# Patient Record
Sex: Female | Born: 1995 | Race: Black or African American | Hispanic: No | Marital: Single | Smoking: Never smoker
Health system: Southern US, Community
[De-identification: ages and names within clinical notes are randomized; demographics above are authoritative.]

## PROBLEM LIST (undated history)

## (undated) DIAGNOSIS — Z9109 Other allergy status, other than to drugs and biological substances: Secondary | ICD-10-CM

## (undated) HISTORY — PX: KNEE SURGERY: SHX244

---
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
## 2017-09-09 ENCOUNTER — Encounter (HOSPITAL_BASED_OUTPATIENT_CLINIC_OR_DEPARTMENT_OTHER): Payer: Self-pay | Admitting: *Deleted

## 2017-09-09 ENCOUNTER — Emergency Department (HOSPITAL_BASED_OUTPATIENT_CLINIC_OR_DEPARTMENT_OTHER)
Admission: EM | Admit: 2017-09-09 | Discharge: 2017-09-09 | Disposition: A | Discharge status: Home / Self Care | Payer: Medicaid - Out of State | Attending: Emergency Medicine | Admitting: Emergency Medicine

## 2017-09-09 ENCOUNTER — Other Ambulatory Visit: Payer: Self-pay

## 2017-09-09 DIAGNOSIS — S01111A Laceration without foreign body of right eyelid and periocular area, initial encounter: Secondary | ICD-10-CM | POA: Insufficient documentation

## 2017-09-09 DIAGNOSIS — Y9389 Activity, other specified: Secondary | ICD-10-CM | POA: Insufficient documentation

## 2017-09-09 DIAGNOSIS — Z79899 Other long term (current) drug therapy: Secondary | ICD-10-CM | POA: Insufficient documentation

## 2017-09-09 DIAGNOSIS — W228XXA Striking against or struck by other objects, initial encounter: Secondary | ICD-10-CM | POA: Insufficient documentation

## 2017-09-09 DIAGNOSIS — Y929 Unspecified place or not applicable: Secondary | ICD-10-CM | POA: Insufficient documentation

## 2017-09-09 DIAGNOSIS — Y998 Other external cause status: Secondary | ICD-10-CM | POA: Insufficient documentation

## 2017-09-09 HISTORY — DX: Other allergy status, other than to drugs and biological substances: Z91.09

## 2017-09-09 MED ORDER — LIDOCAINE HCL (PF) 1 % IJ SOLN
5.0000 mL | Freq: Once | INTRAMUSCULAR | Status: AC
  Administered 2017-09-09: 5 mL via INTRADERMAL

## 2017-09-09 MED ORDER — LIDOCAINE HCL (PF) 1 % IJ SOLN
INTRAMUSCULAR | Status: AC
  Administered 2017-09-09: 5 mL via INTRADERMAL
  Filled 2017-09-09: qty 5

## 2017-09-09 NOTE — ED Notes (Addendum)
Wound approximated well. No oozing or bleeding noted. "feel better". [facility].
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
## 2018-12-02 ENCOUNTER — Other Ambulatory Visit: Payer: Self-pay

## 2018-12-02 ENCOUNTER — Emergency Department (HOSPITAL_BASED_OUTPATIENT_CLINIC_OR_DEPARTMENT_OTHER)
Admission: EM | Admit: 2018-12-02 | Discharge: 2018-12-03 | Disposition: A | Discharge status: Home / Self Care | Payer: Self-pay | Attending: Emergency Medicine | Admitting: Emergency Medicine

## 2018-12-02 ENCOUNTER — Encounter (HOSPITAL_BASED_OUTPATIENT_CLINIC_OR_DEPARTMENT_OTHER): Payer: Self-pay | Admitting: *Deleted

## 2018-12-02 DIAGNOSIS — Y93I9 Activity, other involving external motion: Secondary | ICD-10-CM | POA: Diagnosis not present

## 2018-12-02 DIAGNOSIS — S79919A Unspecified injury of unspecified hip, initial encounter: Secondary | ICD-10-CM | POA: Insufficient documentation

## 2018-12-02 DIAGNOSIS — S199XXA Unspecified injury of neck, initial encounter: Secondary | ICD-10-CM | POA: Insufficient documentation

## 2018-12-02 DIAGNOSIS — Y92410 Unspecified street and highway as the place of occurrence of the external cause: Secondary | ICD-10-CM | POA: Insufficient documentation

## 2018-12-02 DIAGNOSIS — S299XXA Unspecified injury of thorax, initial encounter: Secondary | ICD-10-CM | POA: Insufficient documentation

## 2018-12-02 DIAGNOSIS — Y998 Other external cause status: Secondary | ICD-10-CM | POA: Diagnosis not present
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
## 2018-12-02 NOTE — ED Notes (Signed)
ED Provider at bedside. 
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
## 2018-12-03 ENCOUNTER — Emergency Department (HOSPITAL_BASED_OUTPATIENT_CLINIC_OR_DEPARTMENT_OTHER): Payer: Self-pay

## 2018-12-03 NOTE — ED Notes (Signed)
Patient transported to CT 

## 2018-12-03 NOTE — ED Notes (Signed)
Attempting to d/c patient- pt in room cursing and looking through cabinets. Pt snatched paperwork from this RN- attempting to explain discharge paperwork and pt continued to walk away saying I can read a piece of paper. Pt refused to stop at checkout desk and speak with registration.

## 2018-12-03 NOTE — ED Notes (Signed)
Pt using profanity in hallways wanting an update. RN informed pt EDP would be in shortly and scans had just resulted.

## 2018-12-03 NOTE — ED Notes (Signed)
This EMT went in to ask for a urine sample (U preg) to get x rays and patients stated " Im not peeing, that girl in the other room my girlfriend, I aint pregnant" EMT said I will let the doctor know.,

## 2018-12-03 NOTE — Discharge Instructions (Addendum)
Take ibuprofen 600 mg every 6 hours as needed for pain.  Apply ice to affected areas for 20 minutes every 2 hours while awake for the next 2 days.  Follow-up with primary doctor if symptoms not improving in the next week.

## 2018-12-03 NOTE — ED Notes (Signed)
Pt's family updated on plan of care  

## 2018-12-04 ENCOUNTER — Encounter (HOSPITAL_BASED_OUTPATIENT_CLINIC_OR_DEPARTMENT_OTHER): Payer: Self-pay | Admitting: Emergency Medicine

## 2018-12-04 ENCOUNTER — Emergency Department (HOSPITAL_BASED_OUTPATIENT_CLINIC_OR_DEPARTMENT_OTHER)
Admission: EM | Admit: 2018-12-04 | Discharge: 2018-12-04 | Discharge status: Against Medical Advice | Payer: No Typology Code available for payment source | Attending: Emergency Medicine | Admitting: Emergency Medicine

## 2018-12-04 ENCOUNTER — Other Ambulatory Visit: Payer: Self-pay

## 2018-12-04 DIAGNOSIS — Z5321 Procedure and treatment not carried out due to patient leaving prior to being seen by health care provider: Secondary | ICD-10-CM | POA: Insufficient documentation

## 2018-12-04 DIAGNOSIS — M542 Cervicalgia: Secondary | ICD-10-CM | POA: Insufficient documentation

## 2018-12-04 DIAGNOSIS — M549 Dorsalgia, unspecified: Secondary | ICD-10-CM | POA: Diagnosis present
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
## 2018-12-04 NOTE — ED Triage Notes (Signed)
Pt's chart at [facility] and previous ED visit in our system reviewed. No muscle relaxer prescribed during either visit. Unsure of what patient is taking.

## 2018-12-05 ENCOUNTER — Encounter (HOSPITAL_BASED_OUTPATIENT_CLINIC_OR_DEPARTMENT_OTHER): Payer: Self-pay

## 2018-12-05 ENCOUNTER — Emergency Department (HOSPITAL_BASED_OUTPATIENT_CLINIC_OR_DEPARTMENT_OTHER): Payer: No Typology Code available for payment source

## 2018-12-05 ENCOUNTER — Emergency Department (HOSPITAL_BASED_OUTPATIENT_CLINIC_OR_DEPARTMENT_OTHER)
Admission: EM | Admit: 2018-12-05 | Discharge: 2018-12-05 | Disposition: A | Discharge status: Home / Self Care | Payer: No Typology Code available for payment source | Attending: Emergency Medicine | Admitting: Emergency Medicine

## 2018-12-05 ENCOUNTER — Other Ambulatory Visit: Payer: Self-pay

## 2018-12-05 DIAGNOSIS — M542 Cervicalgia: Secondary | ICD-10-CM | POA: Insufficient documentation

## 2018-12-05 DIAGNOSIS — R51 Headache: Secondary | ICD-10-CM | POA: Diagnosis not present

## 2018-12-05 NOTE — Discharge Instructions (Addendum)
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
## 2019-06-04 ENCOUNTER — Encounter (HOSPITAL_BASED_OUTPATIENT_CLINIC_OR_DEPARTMENT_OTHER): Payer: Self-pay | Admitting: *Deleted

## 2019-06-04 ENCOUNTER — Emergency Department (HOSPITAL_BASED_OUTPATIENT_CLINIC_OR_DEPARTMENT_OTHER)
Admission: EM | Admit: 2019-06-04 | Discharge: 2019-06-04 | Disposition: A | Discharge status: Home / Self Care | Payer: BC Managed Care – PPO | Attending: Emergency Medicine | Admitting: Emergency Medicine

## 2019-06-04 ENCOUNTER — Other Ambulatory Visit: Payer: Self-pay

## 2019-06-04 DIAGNOSIS — J029 Acute pharyngitis, unspecified: Secondary | ICD-10-CM

## 2019-06-04 DIAGNOSIS — G44209 Tension-type headache, unspecified, not intractable: Secondary | ICD-10-CM | POA: Insufficient documentation

## 2019-06-04 DIAGNOSIS — Z880 Allergy status to penicillin: Secondary | ICD-10-CM | POA: Insufficient documentation

## 2019-06-04 DIAGNOSIS — B9789 Other viral agents as the cause of diseases classified elsewhere: Secondary | ICD-10-CM | POA: Diagnosis not present

## 2019-06-04 DIAGNOSIS — J028 Acute pharyngitis due to other specified organisms: Secondary | ICD-10-CM | POA: Diagnosis not present

## 2019-06-04 DIAGNOSIS — R519 Headache, unspecified: Secondary | ICD-10-CM

## 2019-06-04 LAB — GROUP A STREP BY PCR: Group A Strep by PCR: NOT DETECTED

## 2019-06-04 MED ORDER — PROCHLORPERAZINE MALEATE 10 MG PO TABS
10.0000 mg | ORAL_TABLET | Freq: Once | ORAL | Status: AC
  Administered 2019-06-04: 10 mg via ORAL
  Filled 2019-06-04: qty 1

## 2019-06-04 MED ORDER — DIPHENHYDRAMINE HCL 25 MG PO CAPS
25.0000 mg | ORAL_CAPSULE | Freq: Once | ORAL | Status: AC
  Administered 2019-06-04: 25 mg via ORAL
  Filled 2019-06-04: qty 1

## 2019-06-04 MED ORDER — DIPHENHYDRAMINE HCL 25 MG PO TABS: 25.0000 mg | ORAL_TABLET | Freq: Four times a day (QID) | ORAL | 0 refills | Status: AC | PRN

## 2019-06-04 MED ORDER — PROCHLORPERAZINE MALEATE 10 MG PO TABS: 10.0000 mg | ORAL_TABLET | Freq: Two times a day (BID) | ORAL | 0 refills | Status: AC | PRN

## 2019-06-04 NOTE — ED Provider Notes (Signed)
[facility] EMERGENCY DEPARTMENT Provider Note   CSN: [id number] Arrival date & time: 06/04/19  2216     History Chief Complaint  Patient presents with  . Sore Throat    Joan Perez is a 24 y.o. female.  The history is provided by the patient and medical records. No language interpreter was used.  Sore Throat This is a new problem. The current episode started more than 2 days ago. The problem occurs constantly. The problem has not changed since onset.Associated symptoms include headaches. Pertinent negatives include no chest pain, no abdominal pain and no shortness of breath. Nothing aggravates the symptoms. Nothing relieves the symptoms. She has tried nothing for the symptoms. The treatment provided no relief.       Past Medical History:  Diagnosis Date  . Environmental allergies     There are no problems to display for this patient.   Past Surgical History:  Procedure Laterality Date  . COSMETIC SURGERY     BBL with lipo  . KNEE SURGERY Bilateral      OB History   No obstetric history on file.     History reviewed. No pertinent family history.  Social History   Tobacco Use  . Smoking status: Never Smoker  . Smokeless tobacco: Never Used  Substance Use Topics  . Alcohol use: Yes    Comment: occ  . Drug use: No    Home Medications Prior to Admission medications   Medication Sig Start Date End Date Taking? Authorizing Provider  acetaminophen (TYLENOL) 325 MG tablet Take 650 mg by mouth every 6 (six) hours as needed.    [provider]  ibuprofen (ADVIL) 600 MG tablet Take 600 mg by mouth every 6 (six) hours as needed.    [provider]    Allergies    Penicillins and Pollen extract  Review of Systems   Review of Systems  Constitutional: Negative for diaphoresis, fatigue and fever.  HENT: Negative for congestion.   Eyes: Positive for photophobia. Negative for visual disturbance.  Respiratory: Negative for cough,  chest tightness, shortness of breath and wheezing.   Cardiovascular: Negative for chest pain, palpitations and leg swelling.  Gastrointestinal: Negative for abdominal pain, constipation, diarrhea, nausea, rectal pain and vomiting.  Genitourinary: Negative for dysuria and flank pain.  Musculoskeletal: Negative for back pain, neck pain and neck stiffness.  Skin: Negative for rash and wound.  Neurological: Positive for headaches. Negative for dizziness, seizures, facial asymmetry, weakness, light-headedness and numbness.  All other systems reviewed and are negative.   Physical Exam Updated Vital Signs BP 124/68 (BP Location: Left Arm)   Pulse 92   Temp 98.3 F (36.8 C) (Oral)   Resp 18   Ht 5\' 2"  (1.575 m)   Wt 56.2 kg   LMP 05/18/2019   SpO2 100%   BMI 22.66 kg/m   Physical Exam Vitals and nursing note reviewed.  Constitutional:      General: She is not in acute distress.    Appearance: She is well-developed. She is not ill-appearing, toxic-appearing or diaphoretic.  HENT:     Head: Normocephalic and atraumatic.     Right Ear: Tympanic membrane and external ear normal. No swelling. Tympanic membrane is not erythematous.     Left Ear: Tympanic membrane and external ear normal. No swelling. Tympanic membrane is not erythematous.     Nose: Nose normal. No congestion or rhinorrhea.     Mouth/Throat:     Mouth: Mucous membranes are moist.  No oral lesions.     Pharynx: Oropharynx is clear. Uvula midline. No pharyngeal swelling, oropharyngeal exudate, posterior oropharyngeal erythema or uvula swelling.  Eyes:     Conjunctiva/sclera: Conjunctivae normal.     Pupils: Pupils are equal, round, and reactive to light.  Cardiovascular:     Rate and Rhythm: Normal rate.     Heart sounds: Normal heart sounds. No murmur.  Pulmonary:     Effort: No respiratory distress.     Breath sounds: No stridor.  Abdominal:     General: There is no distension.     Tenderness: There is no abdominal  tenderness. There is no rebound.  Musculoskeletal:     Cervical back: Normal range of motion and neck supple.  Skin:    General: Skin is warm.     Capillary Refill: Capillary refill takes less than 2 seconds.     Coloration: Skin is not pale.     Findings: No erythema or rash.  Neurological:     General: No focal deficit present.     Mental Status: She is alert and oriented to person, place, and time.     Motor: No abnormal muscle tone.     Coordination: Coordination normal.     Deep Tendon Reflexes: Reflexes are normal and symmetric.  Psychiatric:        Mood and Affect: Mood normal.     ED Results / Procedures / Treatments   Labs (all labs ordered are listed, but only abnormal results are displayed) Labs Reviewed  GROUP A STREP BY PCR    EKG None  Radiology No results found.  Procedures Procedures (including critical care time)  Medications Ordered in ED Medications  prochlorperazine (COMPAZINE) tablet 10 mg (has no administration in time range)  diphenhydrAMINE (BENADRYL) capsule 25 mg (has no administration in time range)    ED Course  I have reviewed the triage vital signs and the nursing notes.  Pertinent labs & imaging results that were available during my care of the patient were reviewed by me and considered in my medical decision making (see chart for details).    MDM Rules/Calculators/A&P                      Joan Perez is a 24 y.o. female with no significant past medical history who presents with a migrainous type headache and URI symptoms including sore throat for the last week.  She reports that she started with having sore throat, some rhinorrhea and occasional cough.  She denies constant cough.  She reports no fevers or chills but wanted to make sure she did not have a throat infection.  She reports she is having moderate headache with light and sound sensitivity.  This feels like her migraine headaches in the past.  She denies any trauma.   She denies any relief with Motrin or Tylenol but has not taken other medications.  She denies constant nausea or vomiting, urinary or GI symptoms.  She denies any chest pain or shortness of breath.  Denies other complaints.  No focal neurologic deficits on exam.  Lungs are clear and chest is nontender.  Abdomen is nontender.  Patient can move her neck without rigidity.  Patient had no evidence otitis media on ear exam.  Oropharyngeal exam was unremarkable.  No chest tenderness on exam.  Exam otherwise unremarkable.  Patient had her throat swabbed for strep test in triage and it was negative.  Clinical aspect has a viral  URI/pharyngitis causing her symptoms of migrainous type headache.  Patient was offered a headache cocktail with fluids and IV medications but she would rather take oral medications and go home.  She would rather rest and follow-up with PCP or return here if symptoms do not improve.  Given her otherwise well appearance and reassuring exam, we feel this is reasonable.  I have very low suspicion for meningitis or other more concerning infection at this time.  No evidence of Ludwig's angina or deep space neck infection.  No evidence of PTA on exam.  No concerning lung sounds for pneumonia and otherwise patient appears well with reassuring vital signs.  Patient understands return precautions and plan of care.  She no other questions or concerns and was discharged in good condition.    Final Clinical Impression(s) / ED Diagnoses Final diagnoses:  Viral pharyngitis  Acute nonintractable headache, unspecified headache type    Rx / DC Orders ED Discharge Orders         Ordered    prochlorperazine (COMPAZINE) 10 MG tablet  2 times daily PRN     06/04/19 2327    diphenhydrAMINE (BENADRYL) 25 MG tablet  Every 6 hours PRN     06/04/19 2327          Clinical Impression: 1. Viral pharyngitis   2. Acute nonintractable headache, unspecified headache type     Disposition:  Discharge  Condition: Good  I have discussed the results, Dx and Tx plan with the pt(& family if present). He/she/they expressed understanding and agree(s) with the plan. Discharge instructions discussed at great length. Strict return precautions discussed and pt &/or family have verbalized understanding of the instructions. No further questions at time of discharge.    New Prescriptions   DIPHENHYDRAMINE (BENADRYL) 25 MG TABLET    Take 1 tablet (25 mg total) by mouth every 6 (six) hours as needed.   PROCHLORPERAZINE (COMPAZINE) 10 MG TABLET    Take 1 tablet (10 mg total) by mouth 2 (two) times daily as needed for nausea or vomiting.    Follow Up: Joanna Hews, MD [address] [place] [address] [phone]     [facility] [facility] [address] [id number] [address] [place] [address] [phone]       Brennin Durfee, Canary Brim, MD 06/04/19 2337

## 2019-06-04 NOTE — ED Triage Notes (Signed)
Pt reports sore throat, migraine x 1 week.

## 2019-06-04 NOTE — Discharge Instructions (Signed)
Your sore throat is likely due to a viral pharyngitis causing your upper respiratory symptoms.  The strep test was negative.  I suspect this is triggering your migrainous type headache.  We offered IV medications and fluids however you would rather try treating at home with oral medications.  This is very reasonable given your reassuring exam.  Please rest and stay hydrated and follow-up with your primary doctor.  If any symptoms change or worsen, please return to the nearest emergency department.
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
